# Patient Record
Sex: Female | Born: 1950 | Race: Black or African American | Hispanic: No | Smoking: Current every day smoker
Health system: Southern US, Community
[De-identification: ages and names within clinical notes are randomized; demographics above are authoritative.]

## PROBLEM LIST (undated history)

## (undated) DIAGNOSIS — G2581 Restless legs syndrome: Secondary | ICD-10-CM

## (undated) DIAGNOSIS — G8929 Other chronic pain: Secondary | ICD-10-CM

## (undated) DIAGNOSIS — M549 Dorsalgia, unspecified: Secondary | ICD-10-CM

## (undated) DIAGNOSIS — M542 Cervicalgia: Secondary | ICD-10-CM

## (undated) DIAGNOSIS — K219 Gastro-esophageal reflux disease without esophagitis: Secondary | ICD-10-CM

## (undated) HISTORY — PX: BREAST SURGERY: SHX581

## (undated) HISTORY — PX: KNEE ARTHROSCOPY: SHX127

## (undated) HISTORY — PX: APPENDECTOMY: SHX54

## (undated) HISTORY — PX: BREAST EXCISIONAL BIOPSY: SUR124

## (undated) HISTORY — PX: ABDOMINAL HYSTERECTOMY: SHX81

## (undated) HISTORY — PX: CHOLECYSTECTOMY: SHX55

## (undated) HISTORY — PX: ABDOMINAL SURGERY: SHX537

---

## 2018-04-05 ENCOUNTER — Emergency Department (HOSPITAL_COMMUNITY): Payer: Medicare Other

## 2018-04-05 ENCOUNTER — Encounter (HOSPITAL_COMMUNITY): Payer: Self-pay

## 2018-04-05 ENCOUNTER — Emergency Department (HOSPITAL_COMMUNITY)
Admission: EM | Admit: 2018-04-05 | Discharge: 2018-04-05 | Disposition: A | Discharge status: Home / Self Care | Payer: Medicare Other | Attending: Emergency Medicine | Admitting: Emergency Medicine

## 2018-04-05 DIAGNOSIS — R0789 Other chest pain: Secondary | ICD-10-CM | POA: Insufficient documentation

## 2018-04-05 DIAGNOSIS — M542 Cervicalgia: Secondary | ICD-10-CM | POA: Insufficient documentation

## 2018-04-05 DIAGNOSIS — R519 Headache, unspecified: Secondary | ICD-10-CM

## 2018-04-05 DIAGNOSIS — F17200 Nicotine dependence, unspecified, uncomplicated: Secondary | ICD-10-CM | POA: Insufficient documentation

## 2018-04-05 DIAGNOSIS — Z79899 Other long term (current) drug therapy: Secondary | ICD-10-CM | POA: Insufficient documentation

## 2018-04-05 DIAGNOSIS — R51 Headache: Secondary | ICD-10-CM | POA: Insufficient documentation

## 2018-04-05 HISTORY — DX: Dorsalgia, unspecified: M54.9

## 2018-04-05 HISTORY — DX: Restless legs syndrome: G25.81

## 2018-04-05 HISTORY — DX: Gastro-esophageal reflux disease without esophagitis: K21.9

## 2018-04-05 HISTORY — DX: Cervicalgia: M54.2

## 2018-04-05 HISTORY — DX: Other chronic pain: G89.29

## 2018-04-05 LAB — BASIC METABOLIC PANEL
Anion gap: 9 (ref 5–15)
BUN: 8 mg/dL (ref 8–23)
CO2: 24 mmol/L (ref 22–32)
Calcium: 9.7 mg/dL (ref 8.9–10.3)
Chloride: 106 mmol/L (ref 98–111)
Creatinine, Ser: 0.8 mg/dL (ref 0.44–1.00)
GFR calc Af Amer: >60 mL/min (ref >60)
GLUCOSE: 115 mg/dL — AB (ref 70–99)
POTASSIUM: 3.6 mmol/L (ref 3.5–5.1)
Sodium: 139 mmol/L (ref 135–145)

## 2018-04-05 LAB — I-STAT TROPONIN, ED
Troponin i, poc: 0.01 ng/mL (ref 0.00–0.08)
Troponin i, poc: 0.02 ng/mL (ref 0.00–0.08)

## 2018-04-05 LAB — CBC
HCT: 42.8 % (ref 36.0–46.0)
Hemoglobin: 14.2 g/dL (ref 12.0–15.0)
MCH: 31.9 pg (ref 26.0–34.0)
MCHC: 33.2 g/dL (ref 30.0–36.0)
MCV: 96.2 fL (ref 78.0–100.0)
Platelets: 255 10*3/uL (ref 150–400)
RBC: 4.45 MIL/uL (ref 3.87–5.11)
RDW: 13.3 % (ref 11.5–15.5)
WBC: 8.1 10*3/uL (ref 4.0–10.5)

## 2018-04-05 LAB — C-REACTIVE PROTEIN: CRP: <0.8 mg/dL (ref <1.0)

## 2018-04-05 LAB — SEDIMENTATION RATE: Sed Rate: 4 mm/hr (ref 0–22)

## 2018-04-05 MED ORDER — DEXAMETHASONE SODIUM PHOSPHATE 10 MG/ML IJ SOLN
10.0000 mg | Freq: Once | INTRAMUSCULAR | Status: AC
  Administered 2018-04-05: 10 mg via INTRAVENOUS
  Filled 2018-04-05: qty 1

## 2018-04-05 MED ORDER — IOPAMIDOL (ISOVUE-370) INJECTION 76%
50.0000 mL | Freq: Once | INTRAVENOUS | Status: AC | PRN
  Administered 2018-04-05: 50 mL via INTRAVENOUS

## 2018-04-05 MED ORDER — SODIUM CHLORIDE 0.9 % IV BOLUS
1000.0000 mL | Freq: Once | INTRAVENOUS | Status: AC
  Administered 2018-04-05: 1000 mL via INTRAVENOUS

## 2018-04-05 MED ORDER — PROCHLORPERAZINE EDISYLATE 10 MG/2ML IJ SOLN
10.0000 mg | Freq: Once | INTRAMUSCULAR | Status: AC
  Administered 2018-04-05: 10 mg via INTRAVENOUS
  Filled 2018-04-05: qty 2

## 2018-04-05 MED ORDER — KETOROLAC TROMETHAMINE 30 MG/ML IJ SOLN
15.0000 mg | Freq: Once | INTRAMUSCULAR | Status: AC
  Administered 2018-04-05: 15 mg via INTRAVENOUS
  Filled 2018-04-05: qty 1

## 2018-04-05 MED ORDER — DIPHENHYDRAMINE HCL 50 MG/ML IJ SOLN
25.0000 mg | Freq: Once | INTRAMUSCULAR | Status: AC
  Administered 2018-04-05: 25 mg via INTRAVENOUS
  Filled 2018-04-05: qty 1

## 2018-04-05 NOTE — Discharge Instructions (Signed)
Continue to take your home medicines as prescribed.  You can alternate 600 mg of ibuprofen and [phone] mg of Tylenol every 3 hours as needed for pain. Do not exceed 4000 mg of Tylenol daily.  Take ibuprofen with food to avoid upset stomach issues.  Follow-up with your PCP for reevaluation of your symptoms.  Return to the emergency department if any concerning signs or symptoms develop such as worsening chest pain, worsening headache, fevers, altered mental status, or passing out.

## 2018-04-05 NOTE — ED Provider Notes (Signed)
[facility] EMERGENCY DEPARTMENT Provider Note   CSN: [id number] Arrival date & time: 04/05/18  1308     History   Chief Complaint Chief Complaint  Patient presents with  . Chest Pain    HPI Danielle Bowman is a 67 y.o. female with history of chronic neck pain, chronic back pain, GERD, restless leg syndrome, and hyperlipidemia presents for evaluation of gradual onset, progressively worsening head and neck pain for 4 days as well as acute onset, progressively improving left-sided chest pain beginning this morning.  She states she has a history of chronic neck and low back pain but this feels different.  She notes pain to the right side of the neck which is "gnawing" and occurs with lateral rotation, flexion, and extension of the cervical spine.  She notes a sharp shooting pain to the head which begins at the temples and radiates to the occipital region.  She denies any associated vision changes, photophobia, numbness, or weakness but she does note some sensitivity to sound and lightheadedness with ambulation.  Pain worsens to palpation of the skull.  She has tried her home oxycodone, ibuprofen without relief of symptoms.  Denies recent trauma or falls.  She is new to the area and is in the process of establishing care with a new pain management physician.  She notes that she developed left-sided chest pains which radiated up to the jaw that developed around 4 AM this morning.  They have improved since then but it has been constant.  She denies any associated shortness of breath but she does note some nausea.  Denies diaphoresis or vomiting.  She is a current smoker of approximately 5 cigarettes daily, denies recreational drug use or excessive alcohol intake.  The history is provided by the patient.    Past Medical History:  Diagnosis Date  . Chronic back pain   . Chronic neck pain   . GERD (gastroesophageal reflux disease)   . Restless leg syndrome     There are no  active problems to display for this patient.   Past Surgical History:  Procedure Laterality Date  . ABDOMINAL HYSTERECTOMY    . ABDOMINAL SURGERY    . APPENDECTOMY    . BREAST SURGERY    . CHOLECYSTECTOMY    . KNEE ARTHROSCOPY       OB History   None      Home Medications    Prior to Admission medications   Medication Sig Start Date End Date Taking? Authorizing Provider  cyclobenzaprine (FLEXERIL) 10 MG tablet Take 10 mg by mouth 3 (three) times daily as needed for muscle spasms.   Yes [provider]  estradiol (CLIMARA - DOSED IN MG/24 HR) 0.0375 mg/24hr patch Place 0.0375 mg onto the skin every Tuesday.  01/08/18  Yes [provider]  gabapentin (NEURONTIN) 600 MG tablet Take 600 mg by mouth daily as needed (restless legs).   Yes [provider]  oxyCODONE-acetaminophen (PERCOCET) 10-325 MG tablet Take 1 tablet by mouth every 8 (eight) hours as needed for pain.  03/11/18  Yes [provider]  Vitamin D, Ergocalciferol, (DRISDOL) 50000 units CAPS capsule Take 50,000 Units by mouth every Saturday.  01/02/18  Yes [provider]    Family History History reviewed. No pertinent family history.  Social History Social History   Tobacco Use  . Smoking status: Current Every Day Smoker  . Smokeless tobacco: Never Used  Substance Use Topics  . Alcohol use: Not on file  .  Drug use: Not on file     Allergies   Patient has no known allergies.   Review of Systems Review of Systems  Constitutional: Negative for chills, diaphoresis and fever.  Respiratory: Negative for shortness of breath.   Cardiovascular: Positive for chest pain.  Gastrointestinal: Positive for nausea. Negative for abdominal pain, rectal pain and vomiting.  Musculoskeletal: Positive for neck pain.  Neurological: Positive for light-headedness and headaches. Negative for syncope, facial asymmetry, weakness and numbness.  All other systems reviewed and are  negative.    Physical Exam Updated Vital Signs BP 110/64   Pulse 70   Temp 97.9 F (36.6 C) (Oral)   Resp 11   Ht _0  (1.575 m)   Wt 58.5 kg   SpO2 97%   BMI 23.59 kg/m   Physical Exam  Constitutional: She is oriented to person, place, and time. She appears well-developed and well-nourished. No distress.  HENT:  Head: Normocephalic and atraumatic.  Diffuse tenderness to palpation of the forehead and skull with no underlying crepitus or deformity.  No swelling.  Eyes: Conjunctivae are normal. Right eye exhibits no discharge. Left eye exhibits no discharge.  Neck: Normal range of motion. Neck supple. No JVD present. No tracheal deviation present.  No midline cervical spine tenderness.  Bilateral paracervical muscle tenderness noted with spasm primarily on the right as compared to the left.  Normal active range of motion of the cervical spine with pain elicited with flexion, extension, and lateral rotation.  Cardiovascular: Normal rate, regular rhythm, intact distal pulses and normal pulses.  2+ radial and DP/PT pulses bilaterally, Homans sign absent bilaterally, no lower extremity edema, no palpable cords, compartments are soft   Pulmonary/Chest: Effort normal and breath sounds normal.  Equal rise and fall of chest, no increased work of breathing.  Speaking in full sentences without difficulty.  Abdominal: Soft. Bowel sounds are normal. She exhibits no distension. There is no tenderness.  Musculoskeletal: She exhibits no edema.       Right lower leg: Normal. She exhibits no tenderness and no edema.       Left lower leg: Normal. She exhibits no tenderness and no edema.  Neurological: She is alert and oriented to person, place, and time. No cranial nerve deficit.  Mental Status:  Alert, thought content appropriate, able to give a coherent history. Speech fluent without evidence of aphasia. Able to follow 2 step commands without difficulty.  Cranial Nerves:  II:  Peripheral visual  fields grossly normal, pupils equal, round, reactive to light III,IV, VI: ptosis not present, extra-ocular motions intact bilaterally  V,VII: smile symmetric, facial light touch sensation equal VIII: hearing grossly normal to voice  X: uvula elevates symmetrically  XI: bilateral shoulder shrug symmetric and strong XII: midline tongue extension without fassiculations Motor:  Normal tone. 5/5 strength of BUE and BLE major muscle groups including strong and equal grip strength and dorsiflexion/plantar flexion Sensory: light touch normal in all extremities. Cerebellar: normal finger-to-nose with bilateral upper extremities Gait: normal gait and balance. Able to walk on toes and heels with ease.  CV: 2+ radial and DP/PT pulses No pronator drift.  Romberg sign absent.  No nystagmus.  Skin: Skin is warm and dry. No erythema.  Psychiatric: She has a normal mood and affect. Her behavior is normal.  Nursing note and vitals reviewed.    ED Treatments / Results  Labs (all labs ordered are listed, but only abnormal results are displayed) Labs Reviewed  BASIC METABOLIC PANEL - Abnormal;  Notable for the following components:      Result Value   Glucose, Bld 115 (*)    All other components within normal limits  CBC  SEDIMENTATION RATE  C-REACTIVE PROTEIN  I-STAT TROPONIN, ED  I-STAT TROPONIN, ED    EKG EKG Interpretation  Date/Time:  Thursday April 05 2018 13:32:47 EDT Ventricular Rate:  72 PR Interval:  152 QRS Duration: 84 QT Interval:  412 QTC Calculation: 451 R Axis:   42 Text Interpretation:  Normal sinus rhythm Nonspecific T wave abnormality Abnormal ECG Confirmed by Davonna Belling [id number]) on 04/05/2018 5:48:51 PM   Radiology Ct Angio Head W Or Wo Contrast  Result Date: 04/05/2018 CLINICAL DATA:  Initial evaluation for acute neck pain. EXAM: CT ANGIOGRAPHY HEAD AND NECK TECHNIQUE: Multidetector CT imaging of the head and neck was performed using the standard protocol  during bolus administration of intravenous contrast. Multiplanar CT image reconstructions and MIPs were obtained to evaluate the vascular anatomy. Carotid stenosis measurements (when applicable) are obtained utilizing NASCET criteria, using the distal internal carotid diameter as the denominator. CONTRAST:  24m ISOVUE-370 IOPAMIDOL (ISOVUE-370) INJECTION 76% COMPARISON:  None. FINDINGS: CT HEAD FINDINGS Brain: Cerebral volume within normal limits for patient age. No evidence for acute intracranial hemorrhage. No findings to suggest acute large vessel territory infarct. No mass lesion, midline shift, or mass effect. Ventricles are normal in size without evidence for hydrocephalus. No extra-axial fluid collection identified. Vascular: No hyperdense vessel identified. Skull: Scalp soft tissues demonstrate no acute abnormality. Calvarium intact. Sinuses/Orbits: Globes and orbital soft tissues within normal limits. Visualized paranasal sinuses are clear. No mastoid effusion. CTA NECK FINDINGS Aortic arch: Aortic arch normal in caliber with normal branch pattern. No flow-limiting stenosis about the origin of the great vessels. Subclavian arteries widely patent. Right carotid system: Right common and internal carotid arteries widely patent without stenosis, dissection or occlusion. 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 Left carotid system: Left common and internal carotid arteries widely patent without stenosis, dissection, or occlusion. Minimal changes of probable mild FMD within the distal left ICA. Vertebral arteries: Both vertebral arteries arise from the subclavian arteries. Vertebral arteries widely patent within the neck without stenosis, dissection, or occlusion. Skeleton: No acute osseus abnormality. No discrete lytic or blastic osseous lesions. Other neck: No acute soft tissue abnormality within the neck. Upper chest: Visualized upper chest within normal limits. Review of the MIP  images confirms the above findings CTA HEAD FINDINGS Anterior circulation: Internal carotid arteries widely patent to the termini without stenosis. A1 segments, anterior communicating artery common anterior cerebral arteries widely patent. Middle cerebral arteries well perfused and widely patent bilaterally. Posterior circulation: Both vertebral arteries widely patent to the vertebrobasilar junction. Posterior inferior cerebral arteries patent bilaterally. Basilar artery widely patent to its distal aspect. Superior cerebellar and posterior cerebral arteries widely patent bilaterally. Venous sinuses: Patent. Anatomic variants: None significant. Delayed phase: No abnormal enhancement. Review of the MIP images confirms the above findings IMPRESSION: 1. Negative CTA of the neck. No large vessel occlusion or other acute vascular abnormality. No stenosis. 2. 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 Electronically Signed   By: BJeannine BogaM.D.   On: 04/05/2018 22:18   Dg Chest 2 View  Result Date: 04/05/2018 CLINICAL DATA:  Left chest pain today. EXAM: CHEST - 2 VIEW COMPARISON:  None. FINDINGS: The lungs are clear. Heart size is normal. No pneumothorax or pleural effusion. No acute or focal bony abnormality.  IMPRESSION: No acute disease. Electronically Signed   By: Inge Rise M.D.   On: 04/05/2018 14:15   Ct Angio Neck W And/or Wo Contrast  Result Date: 04/05/2018 CLINICAL DATA:  Initial evaluation for acute neck pain. EXAM: CT ANGIOGRAPHY HEAD AND NECK TECHNIQUE: Multidetector CT imaging of the head and neck was performed using the standard protocol during bolus administration of intravenous contrast. Multiplanar CT image reconstructions and MIPs were obtained to evaluate the vascular anatomy. Carotid stenosis measurements (when applicable) are obtained utilizing NASCET criteria, using the distal internal carotid diameter as the denominator. CONTRAST:  65m ISOVUE-370  IOPAMIDOL (ISOVUE-370) INJECTION 76% COMPARISON:  None. FINDINGS: CT HEAD FINDINGS Brain: Cerebral volume within normal limits for patient age. No evidence for acute intracranial hemorrhage. No findings to suggest acute large vessel territory infarct. No mass lesion, midline shift, or mass effect. Ventricles are normal in size without evidence for hydrocephalus. No extra-axial fluid collection identified. Vascular: No hyperdense vessel identified. Skull: Scalp soft tissues demonstrate no acute abnormality. Calvarium intact. Sinuses/Orbits: Globes and orbital soft tissues within normal limits. Visualized paranasal sinuses are clear. No mastoid effusion. CTA NECK FINDINGS Aortic arch: Aortic arch normal in caliber with normal branch pattern. No flow-limiting stenosis about the origin of the great vessels. Subclavian arteries widely patent. Right carotid system: Right common and internal carotid arteries widely patent without stenosis, dissection or occlusion. 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 Left carotid system: Left common and internal carotid arteries widely patent without stenosis, dissection, or occlusion. Minimal changes of probable mild FMD within the distal left ICA. Vertebral arteries: Both vertebral arteries arise from the subclavian arteries. Vertebral arteries widely patent within the neck without stenosis, dissection, or occlusion. Skeleton: No acute osseus abnormality. No discrete lytic or blastic osseous lesions. Other neck: No acute soft tissue abnormality within the neck. Upper chest: Visualized upper chest within normal limits. Review of the MIP images confirms the above findings CTA HEAD FINDINGS Anterior circulation: Internal carotid arteries widely patent to the termini without stenosis. A1 segments, anterior communicating artery common anterior cerebral arteries widely patent. Middle cerebral arteries well perfused and widely patent bilaterally. Posterior  circulation: Both vertebral arteries widely patent to the vertebrobasilar junction. Posterior inferior cerebral arteries patent bilaterally. Basilar artery widely patent to its distal aspect. Superior cerebellar and posterior cerebral arteries widely patent bilaterally. Venous sinuses: Patent. Anatomic variants: None significant. Delayed phase: No abnormal enhancement. Review of the MIP images confirms the above findings IMPRESSION: 1. Negative CTA of the neck. No large vessel occlusion or other acute vascular abnormality. No stenosis. 2. 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 Electronically Signed   By: BJeannine BogaM.D.   On: 04/05/2018 22:18    Procedures Procedures (including critical care time)  Medications Ordered in ED Medications  sodium chloride 0.9 % bolus 1,000 mL (0 mLs Intravenous Stopped 04/05/18 2134)  ketorolac (TORADOL) 30 MG/ML injection 15 mg (15 mg Intravenous Given 04/05/18 1925)  prochlorperazine (COMPAZINE) injection 10 mg (10 mg Intravenous Given 04/05/18 1935)  diphenhydrAMINE (BENADRYL) injection 25 mg (25 mg Intravenous Given 04/05/18 1923)  dexamethasone (DECADRON) injection 10 mg (10 mg Intravenous Given 04/05/18 1936)  iopamidol (ISOVUE-370) 76 % injection 50 mL (50 mLs Intravenous Contrast Given 04/05/18 2115)     Initial Impression / Assessment and Plan / ED Course  I have reviewed the triage vital signs and the nursing notes.  Pertinent labs & imaging results that were available during my  care of the patient were reviewed by me and considered in my medical decision making (see chart for details).     Patient with 4-day history of headache and neck pain as well as left-sided chest pain beginning today.  She is afebrile, vital signs are stable.  She is nontoxic in appearance.  No focal neurologic deficits.  Will obtain CTA head and neck to rule out dissection.  With regards to chest pain, patient's chest x-ray shows no acute  cardiopulmonary abnormalities.  EKG shows normal sinus rhythm, no ST segment abnormality or arrhythmia noted.  Serial troponins are negative.  Doubt ACS/MI.  Remainder of lab work reviewed by me is unremarkable.  Doubt PE, pericarditis, myocarditis, pneumonia. HEART score is 3.  She will follow-up with her PCP on an outpatient basis for evaluation of her chest pains.  CTA of the head and neck shows no large vessel occlusion or acute vascular abnormality.  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  Doubt CVA.  ESR and CRP are within normal limits, doubt temporal arteritis.  No evidence of SAH, ICH, or meningitis.  She was given a migraine cocktail and on reevaluation she is resting comfortably.  She states her headache has significantly improved and she feels comfortable with discharge home.  She has an appointment set up for follow-up with pain management in the near future.  Encouraged her to set up follow-up with PCP for reevaluation of her symptoms especially if they persist.  Discussed strict ED return precautions. Pt verbalized understanding of and agreement with plan and is safe for discharge home at this time.   Final Clinical Impressions(s) / ED Diagnoses   Final diagnoses:  Atypical chest pain  Bad headache  Neck pain, acute    ED Discharge Orders    None       Debroah Baller 04/06/18 0055    Davonna Belling, MD 04/06/18 [phone]

## 2018-04-05 NOTE — ED Triage Notes (Signed)
Pt presents with 2 day h/o L sided chest pain that radiates into lower jaw and worsens with lying supine.  Pt denies any shortness of breath.   Pt also reports 4 day h/o neck pain that radiates up into occipital area to top of head, pt unable to turn head without increase in pain.

## 2018-04-05 NOTE — ED Notes (Signed)
Patient transported to CT 

## 2018-05-17 ENCOUNTER — Other Ambulatory Visit: Payer: Self-pay | Admitting: Physical Medicine and Rehabilitation

## 2018-05-17 DIAGNOSIS — M5416 Radiculopathy, lumbar region: Secondary | ICD-10-CM

## 2018-05-21 ENCOUNTER — Other Ambulatory Visit: Payer: Self-pay | Admitting: Obstetrics and Gynecology

## 2018-05-21 ENCOUNTER — Other Ambulatory Visit: Payer: Self-pay | Admitting: Physical Medicine and Rehabilitation

## 2018-05-21 DIAGNOSIS — Z1231 Encounter for screening mammogram for malignant neoplasm of breast: Secondary | ICD-10-CM

## 2018-05-22 ENCOUNTER — Ambulatory Visit: Payer: Medicare Other

## 2018-05-27 ENCOUNTER — Ambulatory Visit
Admission: RE | Admit: 2018-05-27 | Discharge: 2018-05-27 | Disposition: A | Discharge status: Home / Self Care | Payer: Medicare Other | Source: Ambulatory Visit | Attending: Physical Medicine and Rehabilitation | Admitting: Physical Medicine and Rehabilitation

## 2018-05-27 DIAGNOSIS — M5416 Radiculopathy, lumbar region: Secondary | ICD-10-CM

## 2018-06-20 ENCOUNTER — Other Ambulatory Visit: Payer: Self-pay | Admitting: Obstetrics and Gynecology

## 2018-06-20 DIAGNOSIS — N644 Mastodynia: Secondary | ICD-10-CM

## 2018-07-05 ENCOUNTER — Other Ambulatory Visit: Payer: Medicare Other

## 2018-07-26 ENCOUNTER — Ambulatory Visit: Payer: Medicare Other

## 2018-07-26 ENCOUNTER — Ambulatory Visit
Admission: RE | Admit: 2018-07-26 | Discharge: 2018-07-26 | Disposition: A | Discharge status: Home / Self Care | Payer: Medicare Other | Source: Ambulatory Visit | Attending: Obstetrics and Gynecology | Admitting: Obstetrics and Gynecology

## 2018-07-26 DIAGNOSIS — N644 Mastodynia: Secondary | ICD-10-CM

## 2019-02-21 ENCOUNTER — Other Ambulatory Visit: Payer: Self-pay | Admitting: Family Medicine

## 2019-02-21 DIAGNOSIS — E2839 Other primary ovarian failure: Secondary | ICD-10-CM

## 2019-05-10 ENCOUNTER — Other Ambulatory Visit: Payer: Medicare Other

## 2020-01-01 IMAGING — CR DG CHEST 2V
2 series · 2 of 2 positions shown · non-contrast
Comparison: None.

CLINICAL DATA: Left chest pain today.

EXAM:
CHEST - 2 VIEW

[chest pa]
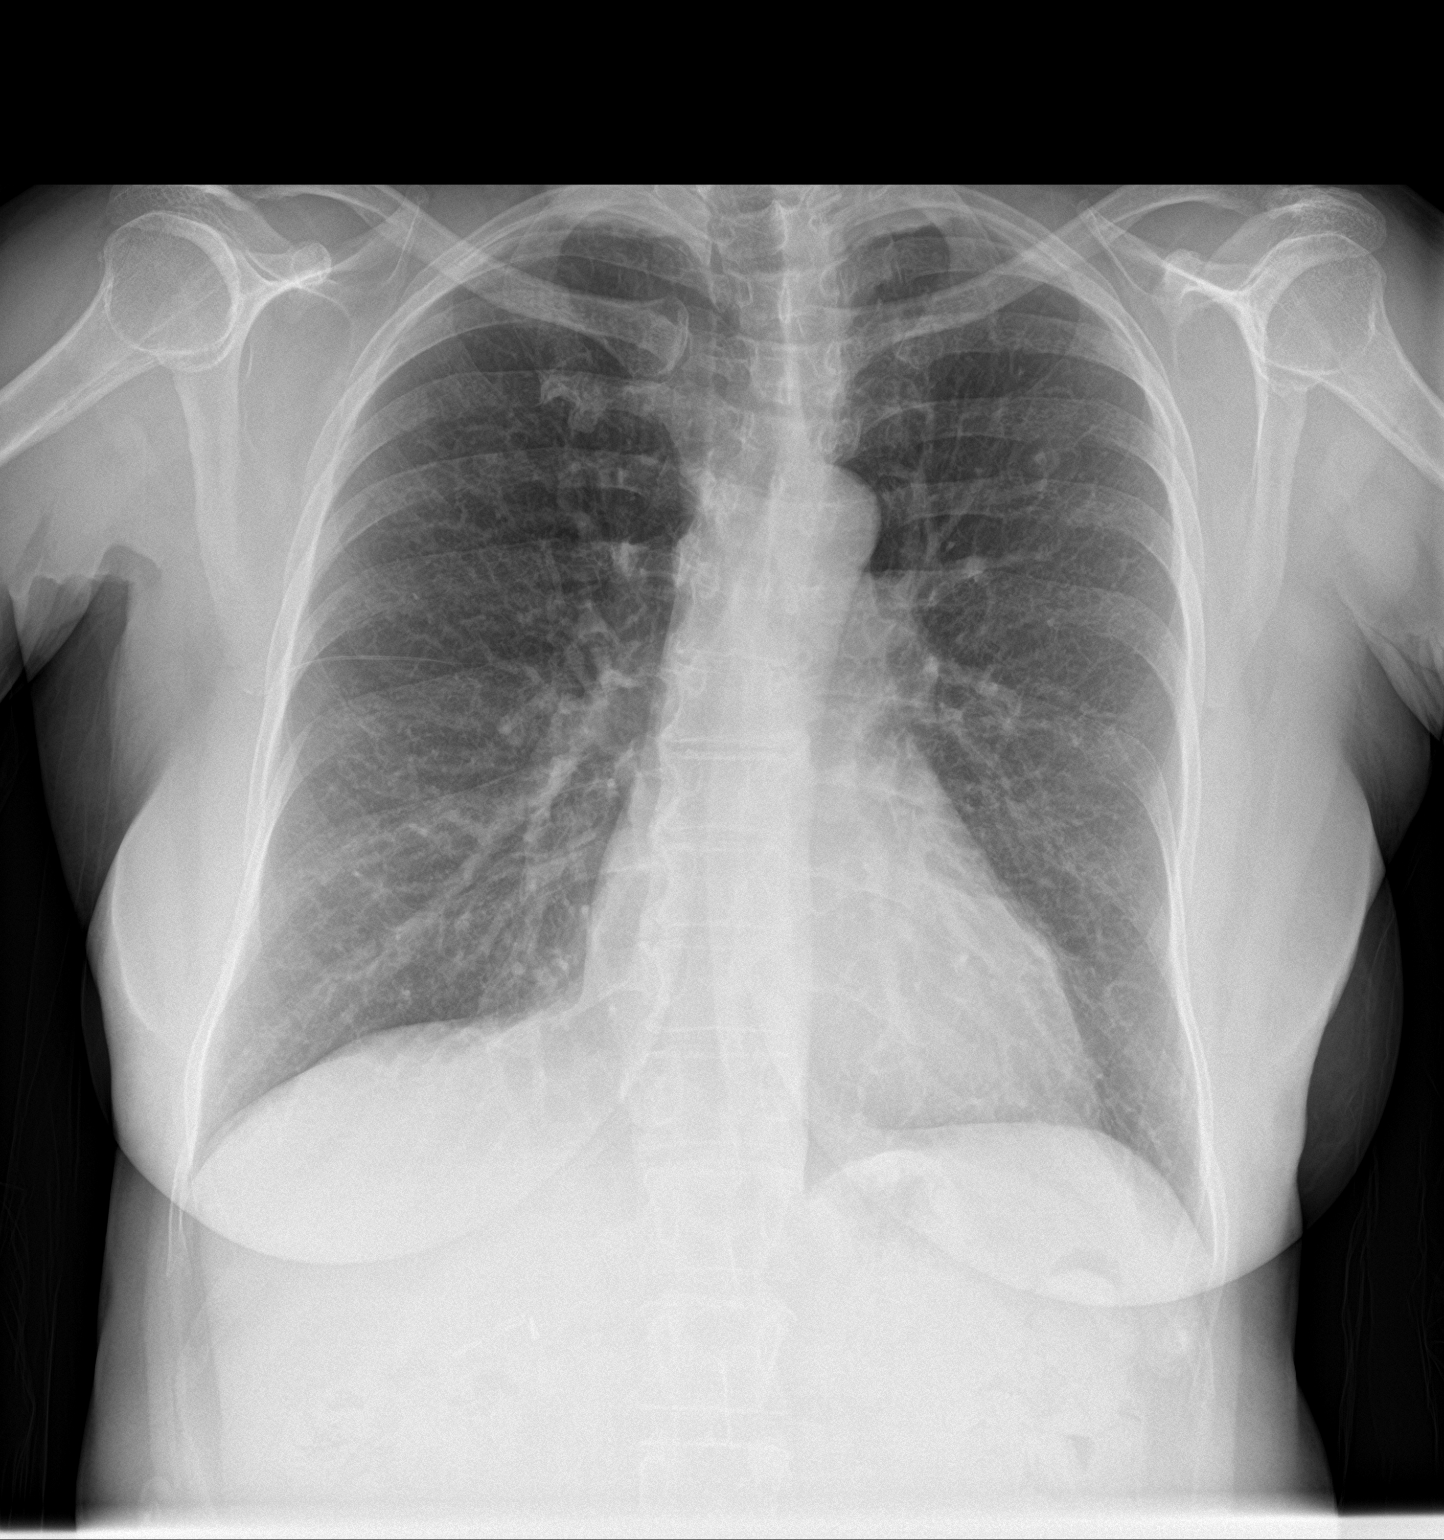

[chest lat]
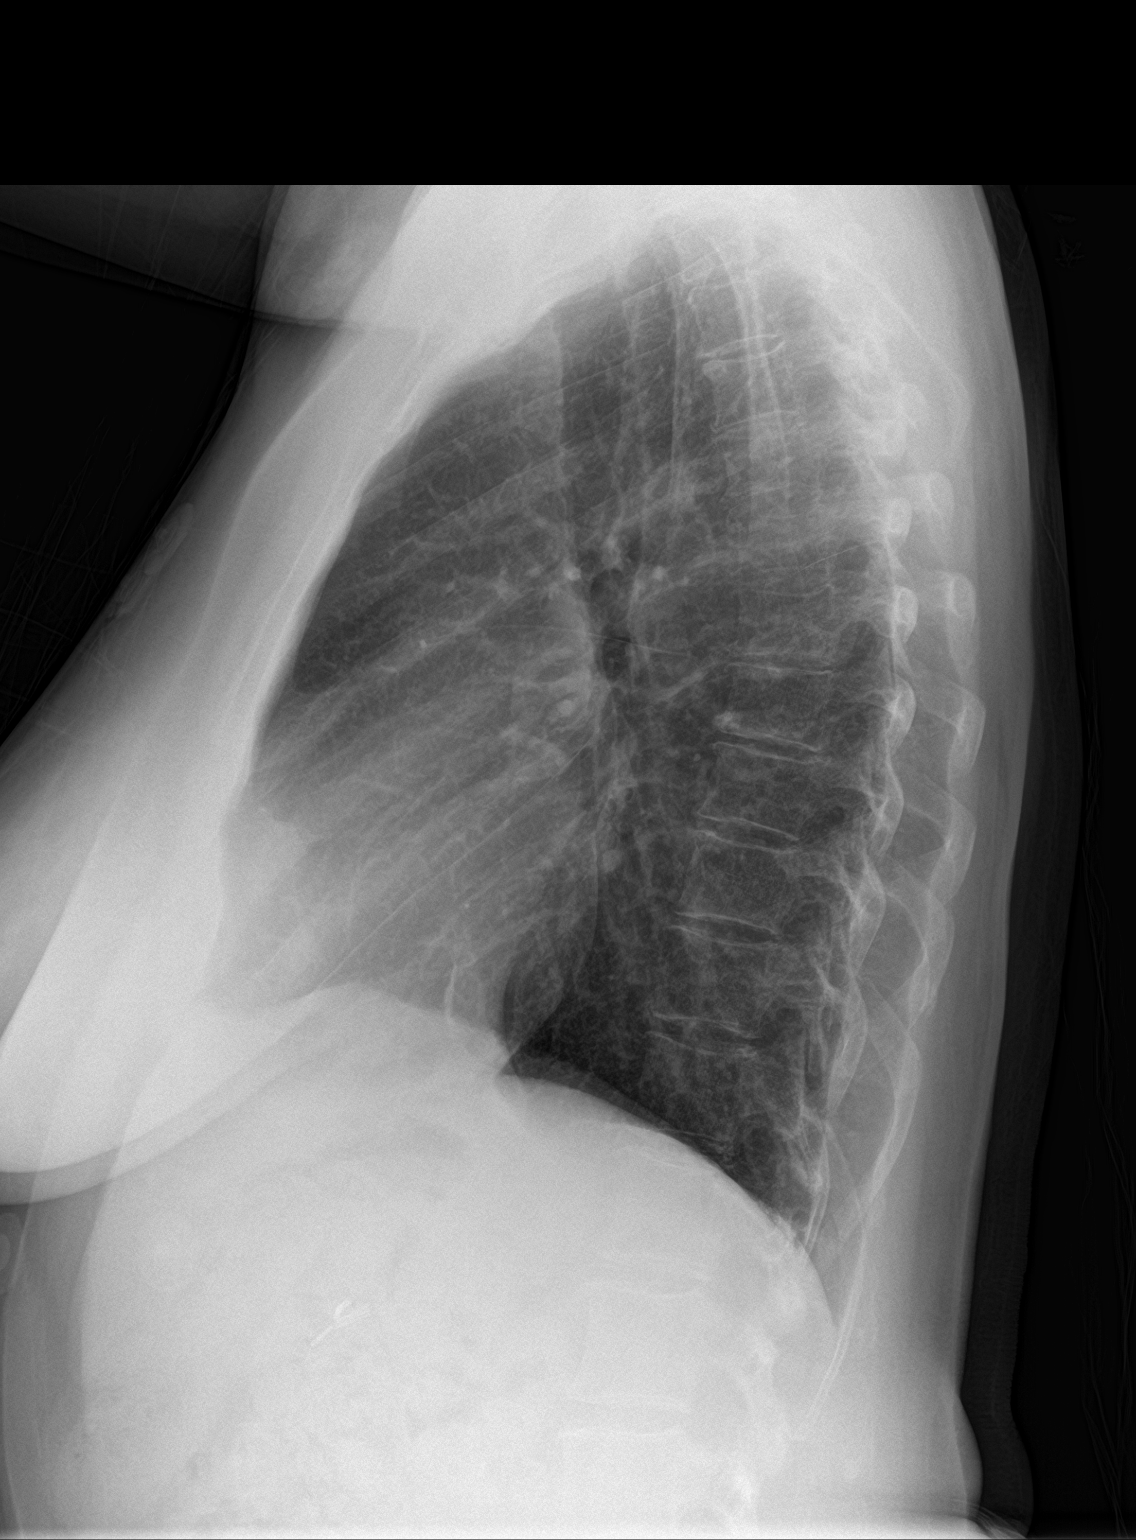

[2 of 2 positions shown; findings below may reference images not displayed]

FINDINGS: The lungs are clear. Heart size is normal. No pneumothorax or
pleural effusion. No acute or focal bony abnormality.
IMPRESSION: No acute disease.
